# Patient Record
Sex: Male | Born: 1955 | Race: Black or African American | Hispanic: No | Marital: Single | Smoking: Current every day smoker
Health system: Southern US, Community
[De-identification: ages and names within clinical notes are randomized; demographics above are authoritative.]

## PROBLEM LIST (undated history)

## (undated) DIAGNOSIS — E785 Hyperlipidemia, unspecified: Secondary | ICD-10-CM

## (undated) DIAGNOSIS — F4310 Post-traumatic stress disorder, unspecified: Secondary | ICD-10-CM

## (undated) DIAGNOSIS — W3400XA Accidental discharge from unspecified firearms or gun, initial encounter: Secondary | ICD-10-CM

## (undated) DIAGNOSIS — Y249XXA Unspecified firearm discharge, undetermined intent, initial encounter: Secondary | ICD-10-CM

---

## 2008-02-06 ENCOUNTER — Emergency Department (HOSPITAL_COMMUNITY): Admission: EM | Admit: 2008-02-06 | Discharge: 2008-02-06 | Payer: Self-pay | Admitting: Emergency Medicine

## 2009-02-04 ENCOUNTER — Emergency Department (HOSPITAL_COMMUNITY): Admission: EM | Admit: 2009-02-04 | Discharge: 2009-02-04 | Payer: Self-pay | Admitting: Emergency Medicine

## 2010-09-05 ENCOUNTER — Emergency Department (HOSPITAL_COMMUNITY): Admission: EM | Admit: 2010-09-05 | Discharge: 2010-09-05 | Payer: Self-pay | Admitting: Emergency Medicine

## 2011-03-12 LAB — CBC
HCT: 39.5 % (ref 39.0–52.0)
Hemoglobin: 13.8 g/dL (ref 13.0–17.0)
MCH: 34 pg (ref 26.0–34.0)
RBC: 4.05 MIL/uL — ABNORMAL LOW (ref 4.22–5.81)

## 2011-03-12 LAB — DIFFERENTIAL
Eosinophils Absolute: 0.1 10*3/uL (ref 0.0–0.7)
Lymphs Abs: 1.4 10*3/uL (ref 0.7–4.0)
Monocytes Relative: 5 % (ref 3–12)
Neutro Abs: 10.5 10*3/uL — ABNORMAL HIGH (ref 1.7–7.7)
Neutrophils Relative %: 83 % — ABNORMAL HIGH (ref 43–77)

## 2011-06-13 ENCOUNTER — Emergency Department (HOSPITAL_COMMUNITY)
Admission: EM | Admit: 2011-06-13 | Discharge: 2011-06-13 | Disposition: A | Discharge status: Home / Self Care | Payer: No Typology Code available for payment source | Attending: Emergency Medicine | Admitting: Emergency Medicine

## 2011-06-13 ENCOUNTER — Emergency Department (HOSPITAL_COMMUNITY): Payer: No Typology Code available for payment source

## 2011-06-13 DIAGNOSIS — M545 Low back pain, unspecified: Secondary | ICD-10-CM | POA: Insufficient documentation

## 2011-06-13 DIAGNOSIS — M25559 Pain in unspecified hip: Secondary | ICD-10-CM | POA: Insufficient documentation

## 2011-06-13 DIAGNOSIS — S335XXA Sprain of ligaments of lumbar spine, initial encounter: Secondary | ICD-10-CM | POA: Insufficient documentation

## 2011-06-13 DIAGNOSIS — M62838 Other muscle spasm: Secondary | ICD-10-CM | POA: Insufficient documentation

## 2011-06-13 DIAGNOSIS — R51 Headache: Secondary | ICD-10-CM | POA: Insufficient documentation

## 2011-06-13 DIAGNOSIS — S139XXA Sprain of joints and ligaments of unspecified parts of neck, initial encounter: Secondary | ICD-10-CM | POA: Insufficient documentation

## 2011-06-13 DIAGNOSIS — M542 Cervicalgia: Secondary | ICD-10-CM | POA: Insufficient documentation

## 2011-06-13 DIAGNOSIS — T148XXA Other injury of unspecified body region, initial encounter: Secondary | ICD-10-CM | POA: Insufficient documentation

## 2011-06-13 LAB — URINALYSIS, ROUTINE W REFLEX MICROSCOPIC
Glucose, UA: NEGATIVE mg/dL
Hgb urine dipstick: NEGATIVE
Specific Gravity, Urine: 1.013 (ref 1.005–1.030)

## 2014-07-28 ENCOUNTER — Encounter (HOSPITAL_COMMUNITY): Payer: Self-pay | Admitting: Emergency Medicine

## 2014-07-28 ENCOUNTER — Emergency Department (HOSPITAL_COMMUNITY)
Admission: EM | Admit: 2014-07-28 | Discharge: 2014-07-28 | Disposition: A | Discharge status: Home / Self Care | Payer: No Typology Code available for payment source | Attending: Emergency Medicine | Admitting: Emergency Medicine

## 2014-07-28 DIAGNOSIS — L03319 Cellulitis of trunk, unspecified: Principal | ICD-10-CM

## 2014-07-28 DIAGNOSIS — F17200 Nicotine dependence, unspecified, uncomplicated: Secondary | ICD-10-CM | POA: Insufficient documentation

## 2014-07-28 DIAGNOSIS — L02219 Cutaneous abscess of trunk, unspecified: Secondary | ICD-10-CM | POA: Insufficient documentation

## 2014-07-28 DIAGNOSIS — L02216 Cutaneous abscess of umbilicus: Secondary | ICD-10-CM

## 2014-07-28 DIAGNOSIS — Y9389 Activity, other specified: Secondary | ICD-10-CM | POA: Insufficient documentation

## 2014-07-28 DIAGNOSIS — T148 Other injury of unspecified body region: Secondary | ICD-10-CM | POA: Insufficient documentation

## 2014-07-28 DIAGNOSIS — Y92009 Unspecified place in unspecified non-institutional (private) residence as the place of occurrence of the external cause: Secondary | ICD-10-CM | POA: Insufficient documentation

## 2014-07-28 DIAGNOSIS — W57XXXA Bitten or stung by nonvenomous insect and other nonvenomous arthropods, initial encounter: Secondary | ICD-10-CM | POA: Insufficient documentation

## 2014-07-28 MED ORDER — MUPIROCIN 2 % EX OINT: 1.0000 "application " | TOPICAL_OINTMENT | Freq: Two times a day (BID) | CUTANEOUS | Status: DC

## 2014-07-28 NOTE — ED Provider Notes (Signed)
CSN: [id number]     Arrival date & time 07/28/14  0007 History   First MD Initiated Contact with Patient 07/28/14 0015     Chief Complaint  Patient presents with  . Insect Bite     (Consider location/radiation/quality/duration/timing/severity/associated sxs/prior Treatment) HPI Samuel Tran is a 58 y.o. male who presents emergency department complaining of a weeping lesion to his naval. States he was doing some garden work for a few days ago, and states he noticed multiple bites to his torso. States all of the healed except for the one in his umbilicus. Patient reports he thinks that this may be a spider bite. He states he has been putting peroxide and witch hazel on it with no improvement. States is draining clear drainage. He states it is not tender. He denies any fever, chills, malaise.  History reviewed. No pertinent past medical history. History reviewed. No pertinent past surgical history. History reviewed. No pertinent family history. History  Substance Use Topics  . Smoking status: Current Every Day Smoker -- 1.00 packs/day  . Smokeless tobacco: Never Used  . Alcohol Use: Yes    Review of Systems  Constitutional: Negative for fever and chills.  Respiratory: Negative for cough, chest tightness and shortness of breath.   Cardiovascular: Negative for chest pain, palpitations and leg swelling.  Gastrointestinal: Negative for nausea, vomiting, abdominal pain, diarrhea and abdominal distention.  Genitourinary: Negative for urgency.  Musculoskeletal: Negative for arthralgias, myalgias, neck pain and neck stiffness.  Skin: Positive for wound. Negative for rash.  Allergic/Immunologic: Negative for immunocompromised state.  Neurological: Negative for dizziness, weakness, light-headedness, numbness and headaches.      Allergies  Review of patient's allergies indicates not on file.  Home Medications   Prior to Admission medications   Not on File   BP 109/78  Pulse 90   Temp(Src) 98.4 F (36.9 C) (Oral)  Resp 18  SpO2 91% Physical Exam  Nursing note and vitals reviewed. Constitutional: He appears well-developed. No distress.  HENT:  Head: Normocephalic.  Eyes: Conjunctivae are normal.  Cardiovascular: Normal rate, regular rhythm and normal heart sounds.   Pulmonary/Chest: Effort normal and breath sounds normal. No respiratory distress. He has no wheezes. He has no rales.  Abdominal: There is no tenderness.  Skin: He is not diaphoretic.  Less than 1 cm draining papule to the umbilicus. Draining clear fluid. Is nontender. It is soft. There is no surrounding induration or erythema.    ED Course  Procedures (including critical care time) Labs Review Labs Reviewed - No data to display  Imaging Review No results found.   EKG Interpretation None      MDM   Final diagnoses:  Abscess of umbilicus    Suspect mild folliculitis, will start on Bactroban topically. At this time I do not think that this lesion needs drainage in emergency department. He is afebrile, nontoxic appearing. No surrounding cellulitis. We'll discharge home with close outpatient followup.   Filed Vitals:   07/28/14 0013  BP: 109/78  Pulse: 90  Temp: 98.4 F (36.9 C)  TempSrc: Oral  Resp: 18  SpO2: 91%     Ilaria Much A Cylis Ayars, PA-C 07/28/14 0102

## 2014-07-28 NOTE — Discharge Instructions (Signed)
bactroban ointment topically twice a day. Keep clean and covered. Wash with soap and water. Follow up with primary care doctor.    Abscess An abscess is an infected area that contains a collection of pus and debris.It can occur in almost any part of the body. An abscess is also known as a furuncle or boil. CAUSES  An abscess occurs when tissue gets infected. This can occur from blockage of oil or sweat glands, infection of hair follicles, or a minor injury to the skin. As the body tries to fight the infection, pus collects in the area and creates pressure under the skin. This pressure causes pain. People with weakened immune systems have difficulty fighting infections and get certain abscesses more often.  SYMPTOMS Usually an abscess develops on the skin and becomes a painful mass that is red, warm, and tender. If the abscess forms under the skin, you may feel a moveable soft area under the skin. Some abscesses break open (rupture) on their own, but most will continue to get worse without care. The infection can spread deeper into the body and eventually into the bloodstream, causing you to feel ill.  DIAGNOSIS  Your caregiver will take your medical history and perform a physical exam. A sample of fluid may also be taken from the abscess to determine what is causing your infection. TREATMENT  Your caregiver may prescribe antibiotic medicines to fight the infection. However, taking antibiotics alone usually does not cure an abscess. Your caregiver may need to make a small cut (incision) in the abscess to drain the pus. In some cases, gauze is packed into the abscess to reduce pain and to continue draining the area. HOME CARE INSTRUCTIONS   Only take over-the-counter or prescription medicines for pain, discomfort, or fever as directed by your caregiver.  If you were prescribed antibiotics, take them as directed. Finish them even if you start to feel better.  If gauze is used, follow your  caregiver's directions for changing the gauze.  To avoid spreading the infection:  Keep your draining abscess covered with a bandage.  Wash your hands well.  Do not share personal care items, towels, or whirlpools with others.  Avoid skin contact with others.  Keep your skin and clothes clean around the abscess.  Keep all follow-up appointments as directed by your caregiver. SEEK MEDICAL CARE IF:   You have increased pain, swelling, redness, fluid drainage, or bleeding.  You have muscle aches, chills, or a general ill feeling.  You have a fever. MAKE SURE YOU:   Understand these instructions.  Will watch your condition.  Will get help right away if you are not doing well or get worse. Document Released: 09/23/2005 Document Revised: 06/14/2012 Document Reviewed: 02/26/2012 [facility] Patient Information 2015 [place], [place]. This information is not intended to replace advice given to you by your health care provider. Make sure you discuss any questions you have with your health care provider.

## 2014-07-28 NOTE — ED Provider Notes (Signed)
  Medical screening examination/treatment/procedure(s) were performed by non-physician practitioner and as supervising physician I was immediately available for consultation/collaboration.   EKG Interpretation None         Gerhard Munchobert Gurnie Duris, MD 07/28/14 0201

## 2014-07-28 NOTE — ED Notes (Signed)
PA at bedside.

## 2014-07-28 NOTE — ED Notes (Signed)
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

## 2014-09-10 ENCOUNTER — Other Ambulatory Visit (HOSPITAL_COMMUNITY): Payer: Self-pay | Admitting: Internal Medicine

## 2014-09-10 ENCOUNTER — Ambulatory Visit (HOSPITAL_COMMUNITY)
Admission: RE | Admit: 2014-09-10 | Discharge: 2014-09-10 | Disposition: A | Discharge status: Home / Self Care | Payer: Self-pay | Source: Ambulatory Visit | Attending: Internal Medicine | Admitting: Internal Medicine

## 2014-09-10 DIAGNOSIS — S99929A Unspecified injury of unspecified foot, initial encounter: Principal | ICD-10-CM

## 2014-09-10 DIAGNOSIS — R52 Pain, unspecified: Secondary | ICD-10-CM

## 2014-09-10 DIAGNOSIS — S8990XA Unspecified injury of unspecified lower leg, initial encounter: Secondary | ICD-10-CM | POA: Insufficient documentation

## 2014-09-10 DIAGNOSIS — S99919A Unspecified injury of unspecified ankle, initial encounter: Principal | ICD-10-CM

## 2014-09-10 DIAGNOSIS — W19XXXA Unspecified fall, initial encounter: Secondary | ICD-10-CM | POA: Insufficient documentation

## 2015-09-23 ENCOUNTER — Emergency Department (HOSPITAL_COMMUNITY)
Admission: EM | Admit: 2015-09-23 | Discharge: 2015-09-23 | Disposition: A | Discharge status: Home / Self Care | Payer: Self-pay | Attending: Emergency Medicine | Admitting: Emergency Medicine

## 2015-09-23 ENCOUNTER — Encounter (HOSPITAL_COMMUNITY): Payer: Self-pay | Admitting: Family Medicine

## 2015-09-23 DIAGNOSIS — Z720 Tobacco use: Secondary | ICD-10-CM | POA: Insufficient documentation

## 2015-09-23 DIAGNOSIS — Y999 Unspecified external cause status: Secondary | ICD-10-CM | POA: Insufficient documentation

## 2015-09-23 DIAGNOSIS — S70361A Insect bite (nonvenomous), right thigh, initial encounter: Secondary | ICD-10-CM | POA: Insufficient documentation

## 2015-09-23 DIAGNOSIS — Z792 Long term (current) use of antibiotics: Secondary | ICD-10-CM | POA: Insufficient documentation

## 2015-09-23 DIAGNOSIS — W57XXXA Bitten or stung by nonvenomous insect and other nonvenomous arthropods, initial encounter: Secondary | ICD-10-CM | POA: Insufficient documentation

## 2015-09-23 DIAGNOSIS — Y929 Unspecified place or not applicable: Secondary | ICD-10-CM | POA: Insufficient documentation

## 2015-09-23 DIAGNOSIS — Y939 Activity, unspecified: Secondary | ICD-10-CM | POA: Insufficient documentation

## 2015-09-23 MED ORDER — PREDNISONE 10 MG PO TABS: 20.0000 mg | ORAL_TABLET | Freq: Two times a day (BID) | ORAL | Status: DC

## 2015-09-23 MED ORDER — LORATADINE 10 MG PO TABS: 10.0000 mg | ORAL_TABLET | Freq: Every day | ORAL | Status: AC

## 2015-09-23 MED ORDER — TRAMADOL HCL 50 MG PO TABS: 50.0000 mg | ORAL_TABLET | Freq: Four times a day (QID) | ORAL | Status: AC | PRN

## 2015-09-23 NOTE — Discharge Instructions (Signed)
Your make take Benadryl as needed in addition to the other medications for itching and burning. Return as needed for worsening symptoms.

## 2015-09-23 NOTE — ED Provider Notes (Signed)
CSN: [id number]     Arrival date & time 09/23/15  1141 History  This chart was scribed for non-physician practitioner Kerrie Buffalo, NP working with Gwyneth Sprout, MD by Murriel Hopper, ED Scribe. This patient was seen in room TR11C/TR11C and the patient's care was started at 2:43 PM.      Chief Complaint  Patient presents with  . Abscess      The history is provided by the patient. No language interpreter was used.   HPI Comments: Samuel Tran is a 59 y.o. male who presents to the Emergency Department complaining of constant, worsening itching, burning, leaking wound on the anterior aspect of his right thigh for a few days. Pt states that he was bitten by an insect a few days ago, and states it became swollen afterwards, but notes that last night the area began to leak and he developed several blisters to the area that are painful to touch. Pt reports there were two places on his thigh where he thought he may have been bitten. Pt denies sore throat, fever.     History reviewed. No pertinent past medical history. History reviewed. No pertinent past surgical history. History reviewed. No pertinent family history. Social History  Substance Use Topics  . Smoking status: Current Every Day Smoker -- 1.00 packs/day  . Smokeless tobacco: Never Used  . Alcohol Use: Yes    Review of Systems Negative except as stated in HPI   Allergies  Review of patient's allergies indicates no known allergies.  Home Medications   Prior to Admission medications   Medication Sig Start Date End Date Taking? Authorizing Provider  loratadine (CLARITIN) 10 MG tablet Take 1 tablet (10 mg total) by mouth daily. 09/23/15   Hope Orlene Och, NP  mupirocin ointment (BACTROBAN) 2 % Place 1 application into the nose 2 (two) times daily. 07/28/14   Tatyana Kirichenko, PA-C  predniSONE (DELTASONE) 10 MG tablet Take 2 tablets (20 mg total) by mouth 2 (two) times daily with a meal. 09/23/15   Hope Orlene Och, NP   traMADol (ULTRAM) 50 MG tablet Take 1 tablet (50 mg total) by mouth every 6 (six) hours as needed. 09/23/15   Hope Orlene Och, NP   BP 106/75 mmHg  Pulse 79  Temp(Src) 98.2 F (36.8 C) (Oral)  Resp 16  Ht  (1.854 m)  Wt 155 lb (70.308 kg)  BMI 20.45 kg/m2  SpO2 100% Physical Exam  Constitutional: He is oriented to person, place, and time. He appears well-developed and well-nourished.  HENT:  Head: Normocephalic and atraumatic.  Cardiovascular: Normal rate.   Pulmonary/Chest: Effort normal.  Abdominal: He exhibits no distension.  Neurological: He is alert and oriented to person, place, and time.  Skin: Skin is warm and dry.  Multiple blistering areas to the right thigh Minimal erythema surrounding the area No streaking No signs of infection  Psychiatric: He has a normal mood and affect.  Nursing note and vitals reviewed.   ED Course  Procedures (including critical care time) Discussed with Dr. Tanna Savoy and she saw the area and agrees that it is local reaction to insect/bee sting.  DIAGNOSTIC STUDIES: Oxygen Saturation is 97% on room air, normal by my interpretation.    COORDINATION OF CARE: 2:47 PM Discussed treatment plan with pt at bedside and pt agreed to plan.   MDM  59 y.o. male with itching, burning and blistering to the right inner thigh. Stable for d/c without red streaking, fever or other signs of infection.  Discussed with the patient clinical findings and plan of care and all questioned fully answered.  Will treat with Claritin, Prednisone and give tramadol for pain.   Final diagnoses:  Insect bite of thigh with local reaction, right, initial encounter   I personally performed the services described in this documentation, which was scribed in my presence. The recorded information has been reviewed and is accurate.     [address] [place], [place] 09/23/15 4010  Gwyneth Sprout, MD 09/25/15 [phone]

## 2015-09-23 NOTE — ED Notes (Signed)
Pt here for abscess to right thigh. sts possible insect bite. sts draining.

## 2015-09-23 NOTE — ED Notes (Signed)
Call no answer, waiting room and subwaiting room.

## 2015-09-23 NOTE — ED Notes (Signed)
Call no answer 

## 2015-09-23 NOTE — ED Notes (Signed)
Declined W/C at D/C and was escorted to lobby by RN. 

## 2019-06-14 ENCOUNTER — Encounter (HOSPITAL_COMMUNITY): Payer: Self-pay

## 2019-06-14 ENCOUNTER — Other Ambulatory Visit: Payer: Self-pay

## 2019-06-14 ENCOUNTER — Emergency Department (HOSPITAL_COMMUNITY)
Admission: EM | Admit: 2019-06-14 | Discharge: 2019-06-14 | Disposition: A | Discharge status: Home / Self Care | Payer: Self-pay | Attending: Emergency Medicine | Admitting: Emergency Medicine

## 2019-06-14 DIAGNOSIS — Z79899 Other long term (current) drug therapy: Secondary | ICD-10-CM | POA: Insufficient documentation

## 2019-06-14 DIAGNOSIS — L0390 Cellulitis, unspecified: Secondary | ICD-10-CM

## 2019-06-14 DIAGNOSIS — F17210 Nicotine dependence, cigarettes, uncomplicated: Secondary | ICD-10-CM | POA: Insufficient documentation

## 2019-06-14 DIAGNOSIS — L03116 Cellulitis of left lower limb: Secondary | ICD-10-CM | POA: Insufficient documentation

## 2019-06-14 HISTORY — DX: Hyperlipidemia, unspecified: E78.5

## 2019-06-14 HISTORY — DX: Post-traumatic stress disorder, unspecified: F43.10

## 2019-06-14 HISTORY — DX: Accidental discharge from unspecified firearms or gun, initial encounter: W34.00XA

## 2019-06-14 HISTORY — DX: Unspecified firearm discharge, undetermined intent, initial encounter: Y24.9XXA

## 2019-06-14 MED ORDER — DOXYCYCLINE HYCLATE 100 MG PO CAPS: 100.0000 mg | ORAL_CAPSULE | Freq: Two times a day (BID) | ORAL | 0 refills | Status: AC

## 2019-06-14 NOTE — Discharge Instructions (Addendum)
You were given a prescription for antibiotics. Please take the antibiotic prescription fully.   Please follow up at urgent care or in the emergency department in 48 hours for a wound recheck.  You are also given information to follow-up with a primary care provider.   Please return to the emergency room immediately if you experience any new or worsening symptoms or any symptoms that indicate worsening infection such as fevers, increased redness/swelling/pain, warmth, or drainage from the affected area.
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
## 2019-06-14 NOTE — ED Triage Notes (Signed)
Pt states he was bit by a black spider on his left inner thighx3 days ago.Pt has 2+ edema of left inner thigh, warm to touch, pt has small hole on inner thigh. Pt has another hole on inner left knee. Pt states it is itching.

## 2019-10-15 ENCOUNTER — Emergency Department (HOSPITAL_COMMUNITY)
Admission: EM | Admit: 2019-10-15 | Discharge: 2019-10-15 | Disposition: A | Discharge status: Home / Self Care | Payer: Self-pay | Attending: Emergency Medicine | Admitting: Emergency Medicine

## 2019-10-15 ENCOUNTER — Other Ambulatory Visit: Payer: Self-pay

## 2019-10-15 ENCOUNTER — Encounter (HOSPITAL_COMMUNITY): Payer: Self-pay

## 2019-10-15 DIAGNOSIS — R220 Localized swelling, mass and lump, head: Secondary | ICD-10-CM

## 2019-10-15 DIAGNOSIS — Z79899 Other long term (current) drug therapy: Secondary | ICD-10-CM | POA: Insufficient documentation

## 2019-10-15 DIAGNOSIS — F17210 Nicotine dependence, cigarettes, uncomplicated: Secondary | ICD-10-CM | POA: Insufficient documentation

## 2019-10-15 DIAGNOSIS — T63441A Toxic effect of venom of bees, accidental (unintentional), initial encounter: Secondary | ICD-10-CM | POA: Insufficient documentation

## 2019-10-15 MED ORDER — IBUPROFEN 200 MG PO TABS
600.0000 mg | ORAL_TABLET | Freq: Once | ORAL | Status: AC
  Administered 2019-10-15: 600 mg via ORAL
  Filled 2019-10-15: qty 1

## 2019-10-15 MED ORDER — EPINEPHRINE 0.3 MG/0.3ML IJ SOAJ: 0.3000 mg | INTRAMUSCULAR | 0 refills | Status: AC | PRN

## 2019-10-15 MED ORDER — PREDNISONE 10 MG (21) PO TBPK: ORAL_TABLET | Freq: Every day | ORAL | 0 refills | Status: DC

## 2019-10-15 MED ORDER — DIPHENHYDRAMINE HCL 25 MG PO CAPS
25.0000 mg | ORAL_CAPSULE | Freq: Once | ORAL | Status: AC
  Administered 2019-10-15: 25 mg via ORAL
  Filled 2019-10-15: qty 1

## 2019-10-15 MED ORDER — FAMOTIDINE 20 MG PO TABS
40.0000 mg | ORAL_TABLET | Freq: Once | ORAL | Status: AC
  Administered 2019-10-15: 40 mg via ORAL
  Filled 2019-10-15: qty 2

## 2019-10-15 MED ORDER — PREDNISONE 20 MG PO TABS
60.0000 mg | ORAL_TABLET | Freq: Once | ORAL | Status: AC
  Administered 2019-10-15: 60 mg via ORAL
  Filled 2019-10-15: qty 3

## 2019-10-15 NOTE — Discharge Instructions (Addendum)
Benadryl and Pepcid as directed. You can take Motrin as needed as directed for pain. Take prednisone as prescribed and complete the full course. Use EpiPen if you develop facial swelling that affects breathing or swallowing and return to the emergency room.

## 2019-10-15 NOTE — ED Triage Notes (Signed)
Patient complains of pain and swelling to face after being stung by bees yesterday-has not taken any otc meds, NO distress
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
## 2020-08-21 ENCOUNTER — Other Ambulatory Visit: Payer: Self-pay | Admitting: Critical Care Medicine

## 2020-08-21 ENCOUNTER — Other Ambulatory Visit: Payer: Self-pay

## 2020-08-21 DIAGNOSIS — Z20822 Contact with and (suspected) exposure to covid-19: Secondary | ICD-10-CM

## 2020-08-23 LAB — SARS-COV-2, NAA 2 DAY TAT

## 2020-08-23 LAB — NOVEL CORONAVIRUS, NAA: SARS-CoV-2, NAA: NOT DETECTED

## 2021-05-30 ENCOUNTER — Ambulatory Visit (HOSPITAL_COMMUNITY)
Admission: EM | Admit: 2021-05-30 | Discharge: 2021-05-30 | Disposition: A | Discharge status: Home / Self Care | Payer: Self-pay | Attending: Physician Assistant | Admitting: Physician Assistant

## 2021-05-30 ENCOUNTER — Other Ambulatory Visit: Payer: Self-pay

## 2021-05-30 ENCOUNTER — Encounter (HOSPITAL_COMMUNITY): Payer: Self-pay

## 2021-05-30 DIAGNOSIS — L089 Local infection of the skin and subcutaneous tissue, unspecified: Secondary | ICD-10-CM

## 2021-05-30 DIAGNOSIS — W57XXXA Bitten or stung by nonvenomous insect and other nonvenomous arthropods, initial encounter: Secondary | ICD-10-CM

## 2021-05-30 MED ORDER — MUPIROCIN 2 % EX OINT: 1.0000 "application " | TOPICAL_OINTMENT | Freq: Two times a day (BID) | CUTANEOUS | 0 refills | Status: DC

## 2021-05-30 MED ORDER — MUPIROCIN 2 % EX OINT: 1.0000 "application " | TOPICAL_OINTMENT | Freq: Two times a day (BID) | CUTANEOUS | 0 refills | Status: AC

## 2021-05-30 MED ORDER — DOXYCYCLINE HYCLATE 100 MG PO CAPS: 100.0000 mg | ORAL_CAPSULE | Freq: Two times a day (BID) | ORAL | 0 refills | Status: AC
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
## 2021-05-30 NOTE — Discharge Instructions (Signed)
Take doxycycline twice a day to cover for infection.  This can upset your stomach so take it with food.  Please stay out of the sun while on this medication.  Keep these areas clean and apply Bactroban ointment.  If anything worsens or you develop any fever, nausea, vomiting you need to go to the emergency room.  If the area of redness and swelling does not improve after being on antibiotics for 24 to 48 hours please return for reevaluation.

## 2021-05-30 NOTE — ED Triage Notes (Addendum)
Pt c/o 2 areas which he believes are spider bites which he first noticed about a week ago. Pt has small red open area to right side of lower back, and itchy area to right side of groin. Pt states feels swollen but this is not obviously visible. Pt states both areas are intermittently painful but mostly itchy. States killed a spider in home and feels may have been from this spider.  Pt reports in the past he was stung by a bee and believes this may have been when epi pen was ordered for him. States bee sting bump was similar to current bumps. Pt denies any trouble breathing or tightness in throat at this time. Pt states he never picked up epi pen.

## 2021-11-26 ENCOUNTER — Other Ambulatory Visit: Payer: Self-pay

## 2021-11-26 ENCOUNTER — Emergency Department (HOSPITAL_COMMUNITY): Payer: Self-pay

## 2021-11-26 ENCOUNTER — Emergency Department (HOSPITAL_COMMUNITY)
Admission: EM | Admit: 2021-11-26 | Discharge: 2021-11-26 | Disposition: A | Discharge status: Home / Self Care | Payer: Self-pay | Attending: Emergency Medicine | Admitting: Emergency Medicine

## 2021-11-26 ENCOUNTER — Encounter (HOSPITAL_COMMUNITY): Payer: Self-pay | Admitting: Emergency Medicine

## 2021-11-26 DIAGNOSIS — F17210 Nicotine dependence, cigarettes, uncomplicated: Secondary | ICD-10-CM | POA: Insufficient documentation

## 2021-11-26 DIAGNOSIS — M545 Low back pain, unspecified: Secondary | ICD-10-CM | POA: Insufficient documentation

## 2021-11-26 LAB — CBG MONITORING, ED: Glucose-Capillary: 99 mg/dL (ref 70–99)

## 2021-11-26 MED ORDER — LIDOCAINE 5 % EX PTCH
2.0000 | MEDICATED_PATCH | CUTANEOUS | Status: DC
  Administered 2021-11-26: 2 via TRANSDERMAL
  Filled 2021-11-26: qty 2

## 2021-11-26 MED ORDER — LIDOCAINE 5 % EX PTCH: 1.0000 | MEDICATED_PATCH | CUTANEOUS | 0 refills | Status: AC

## 2021-11-26 MED ORDER — PREDNISONE 10 MG PO TABS: 40.0000 mg | ORAL_TABLET | Freq: Every day | ORAL | 0 refills | Status: AC

## 2021-11-26 MED ORDER — HYDROCODONE-ACETAMINOPHEN 5-325 MG PO TABS
1.0000 | ORAL_TABLET | Freq: Once | ORAL | Status: AC
  Administered 2021-11-26: 1 via ORAL
  Filled 2021-11-26: qty 1

## 2021-11-26 NOTE — Discharge Instructions (Addendum)
You are seen here today for lower back pain.  As previously discussed, if you have any numbness, tingling, weakness, urinary incontinence, fecal incontinence, urinary retention, please return to the nearest emergency department for reevaluation.  You been prescribed prednisone to take for the next 5 days.  Additionally, try other pain relievers such as Tylenol or ibuprofen as needed.  A primary care provider has been referred to you in his discharge paperwork.  If you have any concern, new or worsening symptoms, please return to the nearest emergency department for reevaluation.

## 2021-11-26 NOTE — ED Provider Notes (Signed)
Emergency Medicine Provider Triage Evaluation Note  Samuel Tran , Tran 65 y.o. male  was evaluated in triage.  Pt complains of lower back pain.  Began 3 to 4 days ago.  Does not radiate into abdomen, lower extremities.  No paresthesias or weakness.  Worse with movement.  No urinary complaints.  No history of AAA, dissection.  No chest pain, shortness of breath.  No history of IV drug use, bowel or bladder incontinence, saddle paresthesia  Review of Systems  Positive: Lower back pain Negative: Fever, weakness, numbness, bowel or bladder incontinence, abdominal pain  Physical Exam  BP 127/83 (BP Location: Right Arm)   Pulse 76   Temp 98.4 F (36.9 C) (Oral)   Resp 17   SpO2 97%  Gen:   Awake, no distress   Resp:  Normal effort  MSK:   Moves extremities without difficulty, diffuse tenderness lumbar paraspinal region Other:    Medical Decision Making  Medically screening exam initiated at 7:40 AM.  Appropriate orders placed.  Samuel Tran was informed that the remainder of the evaluation will be completed by another provider, this initial triage assessment does not replace that evaluation, and the importance of remaining in the ED until their evaluation is complete.  Low back pain  No red flags to suggest AAA, dissection, ischemic injury or infectious process.  Plan x-ray, pain control, reassessment in back for further workup   Samuel Yurkovich A, PA-C 11/26/21 0742    Virgina Norfolk, DO 11/26/21 2094

## 2021-11-26 NOTE — ED Notes (Signed)
Reports lifting briefcase resulting in back pain. Sharp pain starts in lumbar region radiating into both legs. Non-tender spine. Ambulatory, full ROM, strength intact. No loss of bowel function.

## 2021-11-26 NOTE — ED Triage Notes (Signed)
Patient c/o lower back pain x 3-4 days. Pain is worse with movement. Denies any bowel or bladder issues.
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
## 2022-11-16 ENCOUNTER — Other Ambulatory Visit: Payer: Self-pay

## 2022-11-16 ENCOUNTER — Emergency Department (HOSPITAL_COMMUNITY): Payer: Self-pay

## 2022-11-16 ENCOUNTER — Emergency Department (HOSPITAL_COMMUNITY)
Admission: EM | Admit: 2022-11-16 | Discharge: 2022-11-17 | Disposition: A | Discharge status: Home / Self Care | Payer: Self-pay | Attending: Emergency Medicine | Admitting: Emergency Medicine

## 2022-11-16 DIAGNOSIS — Y92410 Unspecified street and highway as the place of occurrence of the external cause: Secondary | ICD-10-CM | POA: Insufficient documentation

## 2022-11-16 DIAGNOSIS — R748 Abnormal levels of other serum enzymes: Secondary | ICD-10-CM | POA: Insufficient documentation

## 2022-11-16 DIAGNOSIS — S80212A Abrasion, left knee, initial encounter: Secondary | ICD-10-CM | POA: Insufficient documentation

## 2022-11-16 DIAGNOSIS — S066XAA Traumatic subarachnoid hemorrhage with loss of consciousness status unknown, initial encounter: Secondary | ICD-10-CM | POA: Insufficient documentation

## 2022-11-16 DIAGNOSIS — S0003XA Contusion of scalp, initial encounter: Secondary | ICD-10-CM | POA: Insufficient documentation

## 2022-11-16 DIAGNOSIS — S80211A Abrasion, right knee, initial encounter: Secondary | ICD-10-CM | POA: Insufficient documentation

## 2022-11-16 DIAGNOSIS — Z23 Encounter for immunization: Secondary | ICD-10-CM | POA: Insufficient documentation

## 2022-11-16 DIAGNOSIS — E871 Hypo-osmolality and hyponatremia: Secondary | ICD-10-CM | POA: Insufficient documentation

## 2022-11-16 DIAGNOSIS — S0081XA Abrasion of other part of head, initial encounter: Secondary | ICD-10-CM | POA: Insufficient documentation

## 2022-11-16 DIAGNOSIS — R1084 Generalized abdominal pain: Secondary | ICD-10-CM | POA: Insufficient documentation

## 2022-11-16 DIAGNOSIS — F17210 Nicotine dependence, cigarettes, uncomplicated: Secondary | ICD-10-CM | POA: Insufficient documentation

## 2022-11-16 LAB — URINALYSIS, ROUTINE W REFLEX MICROSCOPIC
Bilirubin Urine: NEGATIVE
Glucose, UA: NEGATIVE mg/dL
Ketones, ur: NEGATIVE mg/dL
Leukocytes,Ua: NEGATIVE
Nitrite: NEGATIVE
Protein, ur: NEGATIVE mg/dL
Specific Gravity, Urine: 1.01 (ref 1.005–1.030)
pH: 5 (ref 5.0–8.0)

## 2022-11-16 LAB — CBC
HCT: 36.7 % — ABNORMAL LOW (ref 39.0–52.0)
Hemoglobin: 12.4 g/dL — ABNORMAL LOW (ref 13.0–17.0)
MCH: 33.3 pg (ref 26.0–34.0)
MCHC: 33.8 g/dL (ref 30.0–36.0)
MCV: 98.7 fL (ref 80.0–100.0)
Platelets: 228 10*3/uL (ref 150–400)
RBC: 3.72 MIL/uL — ABNORMAL LOW (ref 4.22–5.81)
RDW: 12.7 % (ref 11.5–15.5)
WBC: 9 10*3/uL (ref 4.0–10.5)
nRBC: 0 % (ref 0.0–0.2)

## 2022-11-16 LAB — COMPREHENSIVE METABOLIC PANEL
ALT: 52 U/L — ABNORMAL HIGH (ref 0–44)
AST: 133 U/L — ABNORMAL HIGH (ref 15–41)
Albumin: 4 g/dL (ref 3.5–5.0)
Alkaline Phosphatase: 71 U/L (ref 38–126)
Anion gap: 12 (ref 5–15)
BUN: 11 mg/dL (ref 8–23)
CO2: 22 mmol/L (ref 22–32)
Calcium: 8.9 mg/dL (ref 8.9–10.3)
Chloride: 99 mmol/L (ref 98–111)
Creatinine, Ser: 0.9 mg/dL (ref 0.61–1.24)
GFR, Estimated: >60 mL/min (ref >60)
Glucose, Bld: 100 mg/dL — ABNORMAL HIGH (ref 70–99)
Potassium: 4.2 mmol/L (ref 3.5–5.1)
Sodium: 133 mmol/L — ABNORMAL LOW (ref 135–145)
Total Bilirubin: 0.6 mg/dL (ref 0.3–1.2)
Total Protein: 7.4 g/dL (ref 6.5–8.1)

## 2022-11-16 LAB — SAMPLE TO BLOOD BANK

## 2022-11-16 LAB — PROTIME-INR
INR: 1 (ref 0.8–1.2)
Prothrombin Time: 13 seconds (ref 11.4–15.2)

## 2022-11-16 LAB — I-STAT CHEM 8, ED
BUN: 13 mg/dL (ref 8–23)
Calcium, Ion: 1.02 mmol/L — ABNORMAL LOW (ref 1.15–1.40)
Chloride: 100 mmol/L (ref 98–111)
Creatinine, Ser: 0.9 mg/dL (ref 0.61–1.24)
Glucose, Bld: 97 mg/dL (ref 70–99)
HCT: 40 % (ref 39.0–52.0)
Hemoglobin: 13.6 g/dL (ref 13.0–17.0)
Potassium: 4.4 mmol/L (ref 3.5–5.1)
Sodium: 134 mmol/L — ABNORMAL LOW (ref 135–145)
TCO2: 24 mmol/L (ref 22–32)

## 2022-11-16 LAB — ETHANOL: Alcohol, Ethyl (B): <10 mg/dL (ref <10)

## 2022-11-16 LAB — LACTIC ACID, PLASMA: Lactic Acid, Venous: 1.7 mmol/L (ref 0.5–1.9)

## 2022-11-16 MED ORDER — FENTANYL CITRATE PF 50 MCG/ML IJ SOSY
50.0000 ug | PREFILLED_SYRINGE | Freq: Once | INTRAMUSCULAR | Status: AC
  Administered 2022-11-16: 50 ug via INTRAVENOUS
  Filled 2022-11-16: qty 1

## 2022-11-16 MED ORDER — ONDANSETRON HCL 4 MG/2ML IJ SOLN
4.0000 mg | Freq: Once | INTRAMUSCULAR | Status: AC
  Administered 2022-11-16: 4 mg via INTRAVENOUS
  Filled 2022-11-16: qty 2

## 2022-11-16 MED ORDER — IOHEXOL 350 MG/ML SOLN
75.0000 mL | Freq: Once | INTRAVENOUS | Status: AC | PRN
  Administered 2022-11-16: 75 mL via INTRAVENOUS

## 2022-11-16 MED ORDER — OXYCODONE HCL 5 MG PO TABS: 5.0000 mg | ORAL_TABLET | ORAL | 0 refills | Status: AC | PRN

## 2022-11-16 MED ORDER — SODIUM CHLORIDE 0.9 % IV BOLUS
1000.0000 mL | Freq: Once | INTRAVENOUS | Status: AC
  Administered 2022-11-16: 1000 mL via INTRAVENOUS

## 2022-11-16 MED ORDER — LIDOCAINE 5 % EX PTCH: 1.0000 | MEDICATED_PATCH | Freq: Every day | CUTANEOUS | 0 refills | Status: AC | PRN

## 2022-11-16 MED ORDER — METHOCARBAMOL 500 MG PO TABS: 1000.0000 mg | ORAL_TABLET | Freq: Two times a day (BID) | ORAL | 0 refills | Status: AC

## 2022-11-16 MED ORDER — HYDROMORPHONE HCL 1 MG/ML IJ SOLN
1.0000 mg | Freq: Once | INTRAMUSCULAR | Status: AC
  Administered 2022-11-16: 1 mg via INTRAVENOUS
  Filled 2022-11-16: qty 1

## 2022-11-16 MED ORDER — TETANUS-DIPHTH-ACELL PERTUSSIS 5-2.5-18.5 LF-MCG/0.5 IM SUSY
0.5000 mL | PREFILLED_SYRINGE | Freq: Once | INTRAMUSCULAR | Status: AC
  Administered 2022-11-16: 0.5 mL via INTRAMUSCULAR
  Filled 2022-11-16: qty 0.5

## 2022-11-16 MED ORDER — ACETAMINOPHEN 325 MG PO TABS: 650.0000 mg | ORAL_TABLET | Freq: Four times a day (QID) | ORAL | 0 refills | Status: AC | PRN

## 2022-11-16 NOTE — ED Notes (Signed)
Negative fast per Dr. Wallace Cullens

## 2022-11-16 NOTE — Progress Notes (Signed)
   11/16/22 1750  Clinical Encounter Type  Visited With Patient not available  Visit Type Initial;Trauma  Referral From Nurse  Consult/Referral To Chaplain

## 2022-11-16 NOTE — Discharge Instructions (Addendum)
It was a pleasure caring for you today in the emergency department. ° °Please return to the emergency department for any worsening or worrisome symptoms. ° ° °
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
## 2022-11-16 NOTE — ED Notes (Signed)
Patient transported to CT with RN 
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
## 2022-11-16 NOTE — Progress Notes (Signed)
Called in regards to patients head CT. He sustained a small tSAH to the right cerebellum. No midline shift or mass effect. Recommend follow up head CT in 6 hours and if stable ok to discharge home on concussion protocol.

## 2022-11-17 ENCOUNTER — Emergency Department (HOSPITAL_COMMUNITY): Payer: Self-pay

## 2023-06-15 ENCOUNTER — Ambulatory Visit: Payer: Medicare Other | Admitting: Family Medicine

## 2023-08-10 IMAGING — CR DG LUMBAR SPINE COMPLETE 4+V
5 series · 5 of 5 positions shown · non-contrast
Comparison: 06/13/2011

CLINICAL DATA: Lumbar region back pain beginning 3 days ago while
lifting.

EXAM:
LUMBAR SPINE - COMPLETE 4+ VIEW

[l-spine ap]
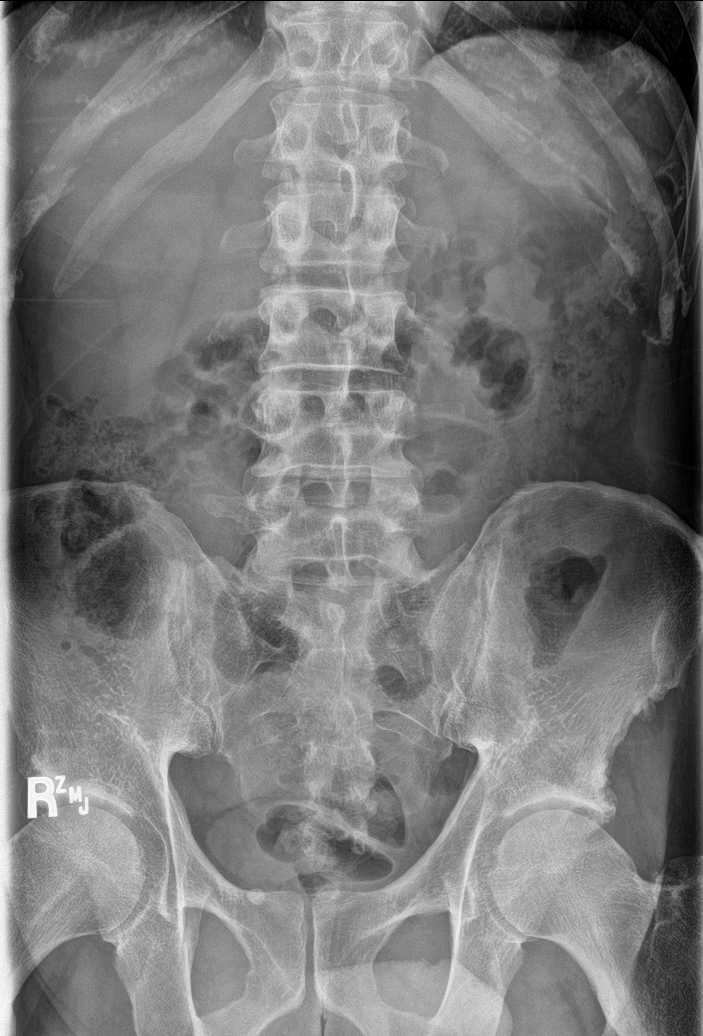

[l-spine obl (1 of 2)]
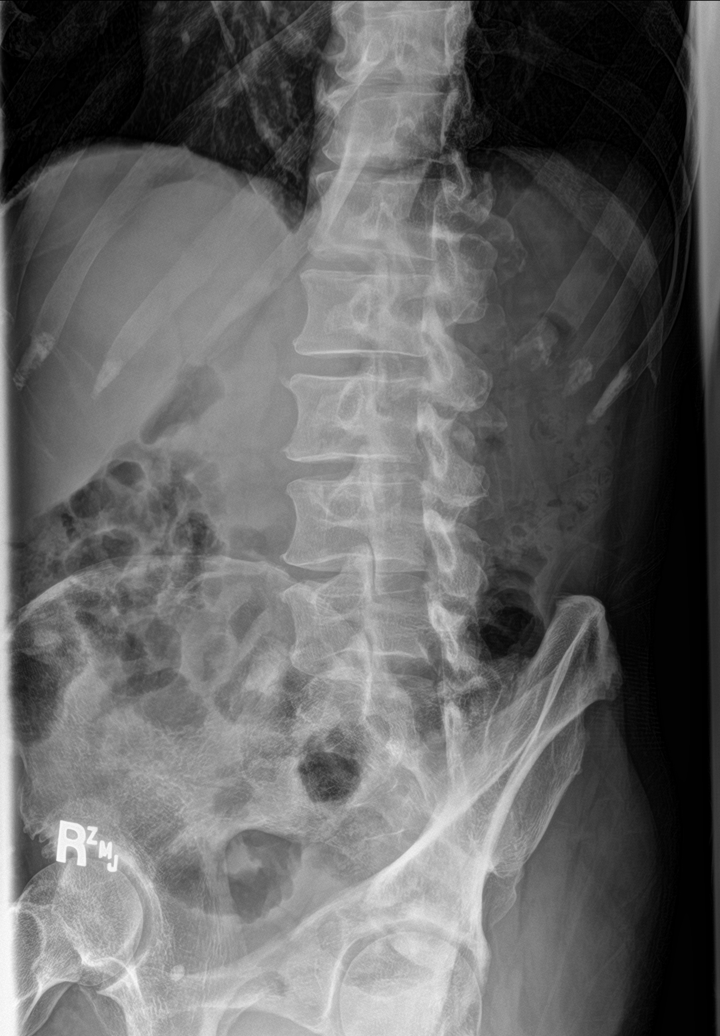

[l-spine obl (2 of 2)]
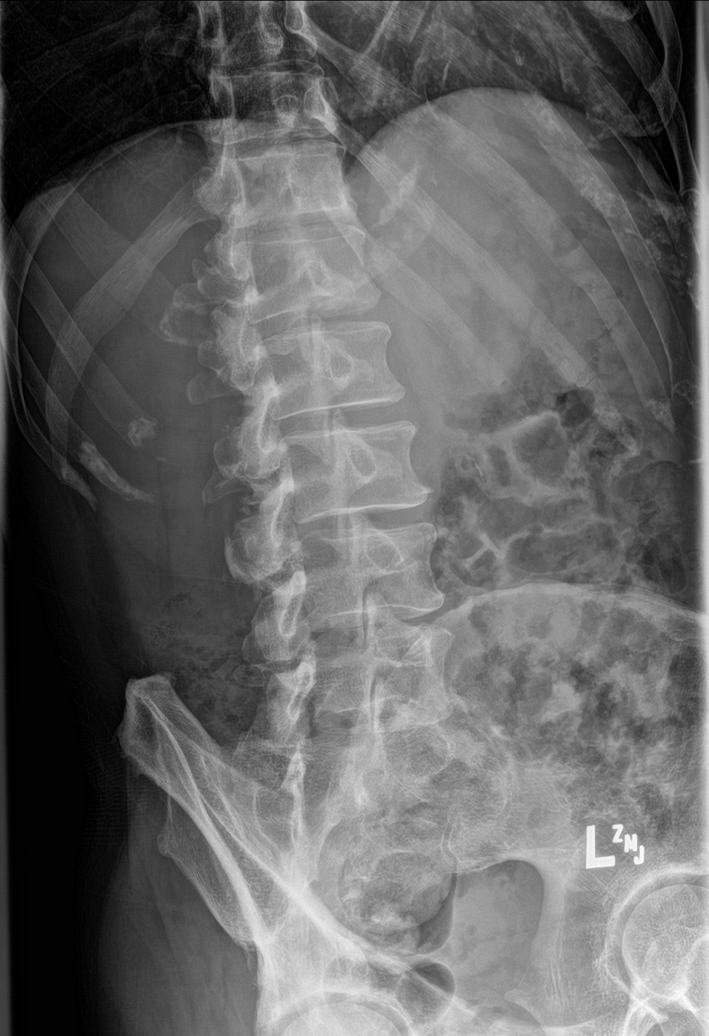

[l-spine lat]
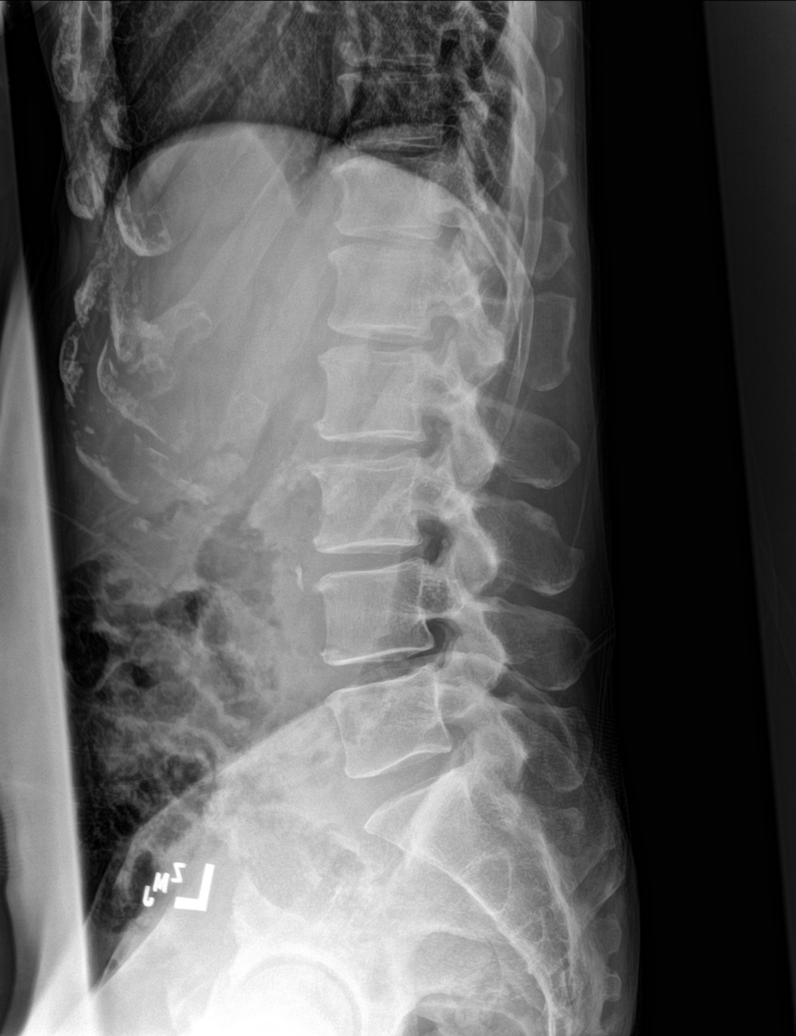

[l-spine spot]
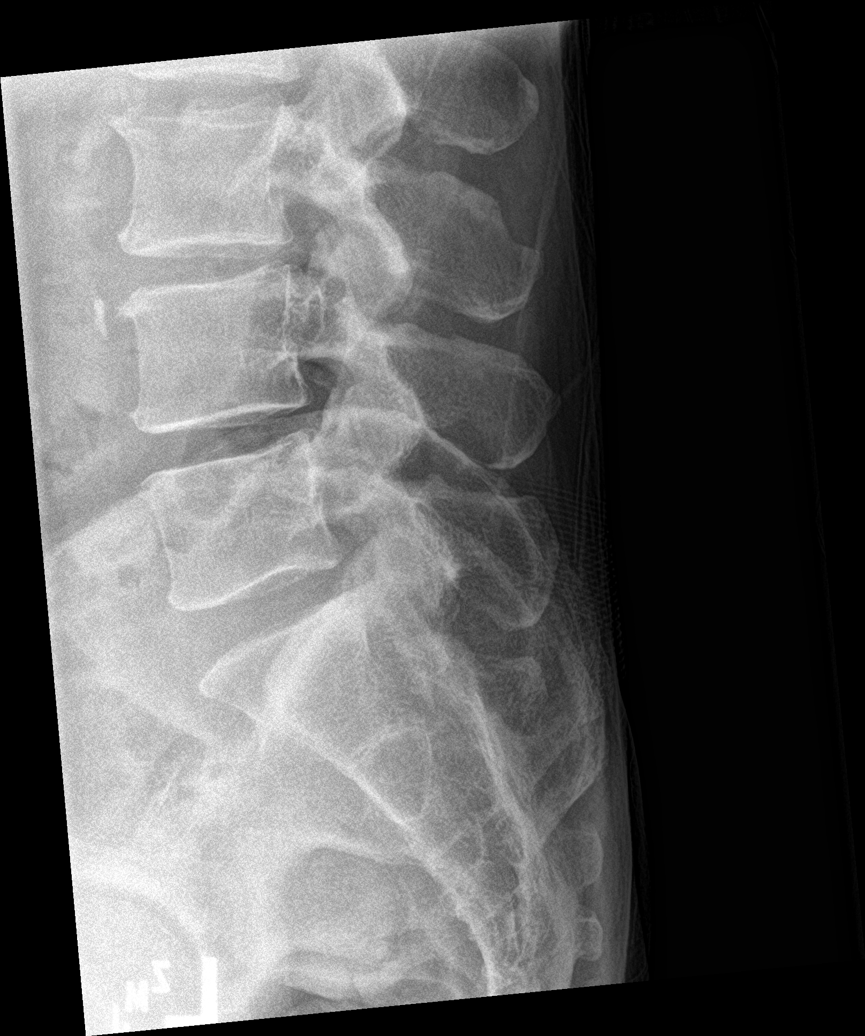

[5 of 5 positions shown; findings below may reference images not displayed]

FINDINGS: Five lumbar type vertebral bodies show normal alignment. No
significant disc space narrowing. No evidence of fracture. No facet
arthropathy or pars defect. Sacroiliac joints appear normal.
IMPRESSION: Lumbar radiographs within normal limits.
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
## 2024-05-03 ENCOUNTER — Encounter: Payer: Medicare HMO | Admitting: Rheumatology

## 2024-05-03 DIAGNOSIS — Z87828 Personal history of other (healed) physical injury and trauma: Secondary | ICD-10-CM

## 2024-05-03 DIAGNOSIS — Z800 Family history of malignant neoplasm of digestive organs: Secondary | ICD-10-CM

## 2024-05-03 DIAGNOSIS — Z8709 Personal history of other diseases of the respiratory system: Secondary | ICD-10-CM

## 2024-05-03 DIAGNOSIS — R768 Other specified abnormal immunological findings in serum: Secondary | ICD-10-CM

## 2024-05-03 DIAGNOSIS — F17210 Nicotine dependence, cigarettes, uncomplicated: Secondary | ICD-10-CM

## 2024-05-03 DIAGNOSIS — F1010 Alcohol abuse, uncomplicated: Secondary | ICD-10-CM

## 2024-05-03 DIAGNOSIS — Z8639 Personal history of other endocrine, nutritional and metabolic disease: Secondary | ICD-10-CM

## 2024-05-03 DIAGNOSIS — Z8679 Personal history of other diseases of the circulatory system: Secondary | ICD-10-CM

## 2024-06-06 ENCOUNTER — Ambulatory Visit: Payer: Medicare HMO | Admitting: Rheumatology

## 2024-12-01 ENCOUNTER — Ambulatory Visit: Admitting: Neurology

## 2024-12-04 ENCOUNTER — Ambulatory Visit: Admitting: Neurology

## 2025-01-12 ENCOUNTER — Ambulatory Visit: Admitting: Diagnostic Neuroimaging

## 2025-01-12 ENCOUNTER — Encounter: Payer: Self-pay | Admitting: Diagnostic Neuroimaging

## 2025-01-12 VITALS — BP 119/77 | HR 69 | Ht 72.0 in | Wt 144.2 lb

## 2025-01-12 DIAGNOSIS — R202 Paresthesia of skin: Secondary | ICD-10-CM | POA: Diagnosis not present

## 2025-01-12 DIAGNOSIS — R200 Anesthesia of skin: Secondary | ICD-10-CM | POA: Diagnosis not present

## 2025-01-12 NOTE — Patient Instructions (Signed)
 NUMBNESS IN FINGERS (since ~May 2025; whole hand; bilateral symptoms) - possible neuropathy; check EMG/NCS; may consider MRI cervical spine - advised to gradually cut down on alcohol and tobacco use
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
## 2025-03-14 ENCOUNTER — Encounter: Admitting: Neurology
